# Patient Record
Sex: Female | Born: 1980 | Race: White | Hispanic: No | Marital: Single | Smoking: Never smoker
Health system: Southern US, Community
[De-identification: ages and names within clinical notes are randomized; demographics above are authoritative.]

## PROBLEM LIST (undated history)

## (undated) DIAGNOSIS — E079 Disorder of thyroid, unspecified: Secondary | ICD-10-CM

## (undated) HISTORY — PX: CARPAL TUNNEL RELEASE: SHX101

## (undated) HISTORY — PX: NECK SURGERY: SHX720

## (undated) HISTORY — PX: CHOLECYSTECTOMY: SHX55

---

## 2002-05-03 ENCOUNTER — Emergency Department (HOSPITAL_COMMUNITY): Admission: EM | Admit: 2002-05-03 | Discharge: 2002-05-03 | Payer: Self-pay | Admitting: *Deleted

## 2015-11-29 ENCOUNTER — Emergency Department (HOSPITAL_COMMUNITY)
Admission: EM | Admit: 2015-11-29 | Discharge: 2015-11-30 | Disposition: A | Discharge status: Home / Self Care | Payer: Self-pay | Attending: Emergency Medicine | Admitting: Emergency Medicine

## 2015-11-29 DIAGNOSIS — R938 Abnormal findings on diagnostic imaging of other specified body structures: Secondary | ICD-10-CM | POA: Insufficient documentation

## 2015-11-29 DIAGNOSIS — R51 Headache: Secondary | ICD-10-CM | POA: Insufficient documentation

## 2015-11-29 DIAGNOSIS — Y939 Activity, unspecified: Secondary | ICD-10-CM | POA: Insufficient documentation

## 2015-11-29 DIAGNOSIS — Y999 Unspecified external cause status: Secondary | ICD-10-CM | POA: Insufficient documentation

## 2015-11-29 DIAGNOSIS — S40212A Abrasion of left shoulder, initial encounter: Secondary | ICD-10-CM | POA: Insufficient documentation

## 2015-11-29 DIAGNOSIS — Y92410 Unspecified street and highway as the place of occurrence of the external cause: Secondary | ICD-10-CM | POA: Insufficient documentation

## 2015-11-29 DIAGNOSIS — R109 Unspecified abdominal pain: Secondary | ICD-10-CM | POA: Insufficient documentation

## 2015-11-29 HISTORY — DX: Disorder of thyroid, unspecified: E07.9
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
## 2015-11-30 ENCOUNTER — Emergency Department (HOSPITAL_COMMUNITY): Payer: Self-pay

## 2015-11-30 ENCOUNTER — Encounter (HOSPITAL_COMMUNITY): Payer: Self-pay | Admitting: *Deleted

## 2015-11-30 LAB — COMPREHENSIVE METABOLIC PANEL
ALT: 19 U/L (ref 14–54)
ANION GAP: 5 (ref 5–15)
AST: 24 U/L (ref 15–41)
Albumin: 4.1 g/dL (ref 3.5–5.0)
Alkaline Phosphatase: 54 U/L (ref 38–126)
BILIRUBIN TOTAL: 0.6 mg/dL (ref 0.3–1.2)
BUN: 6 mg/dL (ref 6–20)
CHLORIDE: 107 mmol/L (ref 101–111)
CO2: 27 mmol/L (ref 22–32)
Calcium: 8.6 mg/dL — ABNORMAL LOW (ref 8.9–10.3)
Creatinine, Ser: 0.68 mg/dL (ref 0.44–1.00)
GFR calc Af Amer: >60 mL/min (ref >60)
Glucose, Bld: 109 mg/dL — ABNORMAL HIGH (ref 65–99)
POTASSIUM: 3.2 mmol/L — AB (ref 3.5–5.1)
Sodium: 139 mmol/L (ref 135–145)
TOTAL PROTEIN: 7.1 g/dL (ref 6.5–8.1)

## 2015-11-30 LAB — URINALYSIS, ROUTINE W REFLEX MICROSCOPIC
Bilirubin Urine: NEGATIVE
Glucose, UA: NEGATIVE mg/dL
Hgb urine dipstick: NEGATIVE
KETONES UR: NEGATIVE mg/dL
LEUKOCYTES UA: NEGATIVE
NITRITE: NEGATIVE
PROTEIN: NEGATIVE mg/dL
Specific Gravity, Urine: 1.003 — ABNORMAL LOW (ref 1.005–1.030)
pH: 7 (ref 5.0–8.0)

## 2015-11-30 LAB — CBC WITH DIFFERENTIAL/PLATELET
BASOS ABS: 0 10*3/uL (ref 0.0–0.1)
BASOS PCT: 0 %
EOS PCT: 1 %
Eosinophils Absolute: 0.1 10*3/uL (ref 0.0–0.7)
HEMATOCRIT: 39.7 % (ref 36.0–46.0)
Hemoglobin: 12.5 g/dL (ref 12.0–15.0)
LYMPHS PCT: 41 %
Lymphs Abs: 3.5 10*3/uL (ref 0.7–4.0)
MCH: 26.8 pg (ref 26.0–34.0)
MCHC: 31.5 g/dL (ref 30.0–36.0)
MCV: 85 fL (ref 78.0–100.0)
MONO ABS: 0.6 10*3/uL (ref 0.1–1.0)
MONOS PCT: 7 %
NEUTROS ABS: 4.2 10*3/uL (ref 1.7–7.7)
Neutrophils Relative %: 51 %
PLATELETS: 371 10*3/uL (ref 150–400)
RBC: 4.67 MIL/uL (ref 3.87–5.11)
RDW: 14.1 % (ref 11.5–15.5)
WBC: 8.4 10*3/uL (ref 4.0–10.5)

## 2015-11-30 LAB — I-STAT CHEM 8, ED
BUN: 6 mg/dL (ref 6–20)
CALCIUM ION: 1.13 mmol/L (ref 1.13–1.30)
CREATININE: 0.7 mg/dL (ref 0.44–1.00)
Chloride: 104 mmol/L (ref 101–111)
GLUCOSE: 107 mg/dL — AB (ref 65–99)
HCT: 41 % (ref 36.0–46.0)
HEMOGLOBIN: 13.9 g/dL (ref 12.0–15.0)
POTASSIUM: 3.3 mmol/L — AB (ref 3.5–5.1)
Sodium: 144 mmol/L (ref 135–145)
TCO2: 28 mmol/L (ref 0–100)

## 2015-11-30 LAB — LIPASE, BLOOD: LIPASE: 35 U/L (ref 11–51)

## 2015-11-30 LAB — POC URINE PREG, ED: PREG TEST UR: NEGATIVE

## 2015-11-30 MED ORDER — SODIUM CHLORIDE 0.9 % IV BOLUS (SEPSIS)
1000.0000 mL | Freq: Once | INTRAVENOUS | Status: AC
Start: 2015-11-30 — End: 2015-11-30
  Administered 2015-11-30: 1000 mL via INTRAVENOUS

## 2015-11-30 MED ORDER — KETOROLAC TROMETHAMINE 30 MG/ML IJ SOLN
30.0000 mg | Freq: Once | INTRAMUSCULAR | Status: AC
  Administered 2015-11-30: 30 mg via INTRAVENOUS
  Filled 2015-11-30: qty 1

## 2015-11-30 MED ORDER — IOPAMIDOL (ISOVUE-300) INJECTION 61%
INTRAVENOUS | Status: AC
  Administered 2015-11-30: 100 mL
  Filled 2015-11-30: qty 100

## 2015-11-30 NOTE — ED Notes (Signed)
Pt stable, ambulatory, states understanding of discharge instructions 
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
